# Patient Record
Sex: Male | Born: 1985 | Race: White | Hispanic: No | Marital: Married | Smoking: Never smoker
Health system: Southern US, Community
[De-identification: ages and names within clinical notes are randomized; demographics above are authoritative.]

## PROBLEM LIST (undated history)

## (undated) DIAGNOSIS — Z789 Other specified health status: Secondary | ICD-10-CM

## (undated) HISTORY — PX: OTHER SURGICAL HISTORY: SHX169

## (undated) HISTORY — DX: Other specified health status: Z78.9

---
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
## 2004-08-03 ENCOUNTER — Emergency Department (HOSPITAL_COMMUNITY): Admission: EM | Admit: 2004-08-03 | Discharge: 2004-08-03 | Payer: Self-pay | Admitting: Emergency Medicine

## 2004-12-16 ENCOUNTER — Encounter: Admission: RE | Admit: 2004-12-16 | Discharge: 2004-12-16 | Payer: Self-pay | Admitting: Internal Medicine

## 2007-01-26 ENCOUNTER — Encounter: Admission: RE | Admit: 2007-01-26 | Discharge: 2007-01-26 | Payer: Self-pay | Admitting: Internal Medicine

## 2011-11-30 ENCOUNTER — Ambulatory Visit (HOSPITAL_COMMUNITY)
Admission: RE | Admit: 2011-11-30 | Discharge: 2011-11-30 | Disposition: A | Discharge status: Home / Self Care | Payer: BC Managed Care – PPO | Source: Ambulatory Visit | Attending: Internal Medicine | Admitting: Internal Medicine

## 2011-11-30 DIAGNOSIS — R011 Cardiac murmur, unspecified: Secondary | ICD-10-CM

## 2011-11-30 NOTE — Progress Notes (Signed)
  Echocardiogram 2D Echocardiogram has been performed.  Jorje Guild [facility] 11/30/2011, 11:04 AM

## 2017-08-09 ENCOUNTER — Other Ambulatory Visit: Payer: Self-pay | Admitting: Internal Medicine

## 2017-08-09 ENCOUNTER — Ambulatory Visit
Admission: RE | Admit: 2017-08-09 | Discharge: 2017-08-09 | Disposition: A | Discharge status: Home / Self Care | Payer: 59 | Source: Ambulatory Visit | Attending: Internal Medicine | Admitting: Internal Medicine

## 2017-08-09 DIAGNOSIS — M5489 Other dorsalgia: Secondary | ICD-10-CM

## 2018-05-23 ENCOUNTER — Ambulatory Visit
Admission: RE | Admit: 2018-05-23 | Discharge: 2018-05-23 | Disposition: A | Discharge status: Home / Self Care | Payer: 59 | Source: Ambulatory Visit | Attending: Internal Medicine | Admitting: Internal Medicine

## 2018-05-23 ENCOUNTER — Other Ambulatory Visit: Payer: Self-pay | Admitting: Internal Medicine

## 2018-05-23 DIAGNOSIS — R05 Cough: Secondary | ICD-10-CM

## 2018-05-23 DIAGNOSIS — R059 Cough, unspecified: Secondary | ICD-10-CM

## 2018-06-11 ENCOUNTER — Ambulatory Visit
Admission: RE | Admit: 2018-06-11 | Discharge: 2018-06-11 | Disposition: A | Discharge status: Home / Self Care | Payer: 59 | Source: Ambulatory Visit | Attending: Internal Medicine | Admitting: Internal Medicine

## 2018-06-11 ENCOUNTER — Other Ambulatory Visit: Payer: Self-pay | Admitting: Internal Medicine

## 2018-06-11 DIAGNOSIS — J189 Pneumonia, unspecified organism: Secondary | ICD-10-CM

## 2018-06-12 ENCOUNTER — Encounter: Payer: Self-pay | Admitting: *Deleted

## 2018-06-13 ENCOUNTER — Encounter: Payer: Self-pay | Admitting: Diagnostic Neuroimaging

## 2018-06-13 ENCOUNTER — Ambulatory Visit: Payer: 59 | Admitting: Diagnostic Neuroimaging

## 2018-06-13 DIAGNOSIS — G4489 Other headache syndrome: Secondary | ICD-10-CM | POA: Diagnosis not present

## 2018-06-13 NOTE — Progress Notes (Signed)
[facility]  PATIENT: Jeremy Sloan DOB: 04-16-1986  REFERRING CLINICIAN: Madelin Rear, MD HISTORY FROM: patient  REASON FOR VISIT: new consult    HISTORICAL  CHIEF COMPLAINT:  Chief Complaint  Patient presents with  . Numbness    rm 6, New Pt, " visual disturbances, blind spot on left, transient hand numbness, headache, fogginess"    HISTORY OF PRESENT ILLNESS:   32 year old male here for evaluation of abnormal spell.  Last Friday patient had sudden onset of visual disturbance and visual loss lasting for 20 minutes.  He was having trouble reading his computer screen.  Soon after his left hand became numb.  He also felt numbness in his mouth and roof of mouth.  He had a dull nasal headache following this.  Symptoms recurred this past Monday in a similar fashion.  He recalls seeing visual disturbance slowly moving across his visual field to the left side.  He also is noticed "overexposed" halo around objects towards the left side.  Left hand again became numb.  Then mouth became numb.  Then he had a dull nasal type headache.  No nausea or vomiting.  No sensitive light or sound.  Patient does have history of headaches from age 54 years old until 32 years old with severe quality and vomiting.  He was never officially diagnosed with migraine.  Patient also has family history of cerebral aneurysm, ruptured with stroke in his mother.  No family history of migraine.  2 weeks prior to onset of symptoms patient was diagnosed with severe pneumonia, "bedridden" for approximately 2 weeks, with significantly decreased p.o. intake and 15 pound weight loss.  His neurologic symptoms and headache occurred approximately 1 week after resolution of pneumonia and returning to work.   REVIEW OF SYSTEMS: Full 14 system review of systems performed and negative with exception of: Memory loss weight loss.  ALLERGIES: No Known Allergies  HOME MEDICATIONS: Outpatient Medications  Prior to Visit  Medication Sig Dispense Refill  . ALPRAZolam (XANAX) 0.25 MG tablet Take 0.25 mg by mouth as needed for anxiety.    Marland Kitchen HYDROcodone-acetaminophen (NORCO/VICODIN) 5-325 MG tablet Take 1 tablet by mouth every 6 (six) hours as needed for moderate pain.     No facility-administered medications prior to visit.     PAST MEDICAL HISTORY: Past Medical History:  Diagnosis Date  . Known health problems: none     PAST SURGICAL HISTORY: Past Surgical History:  Procedure Laterality Date  . none     FAMILY HISTORY: Family History  Problem Relation Age of Onset  . Stroke Mother        due to aneurysm  . Cerebral aneurysm Mother        ruptured    SOCIAL HISTORY: Social History   Socioeconomic History  . Marital status: Married    Spouse name: Worthy Rancher  . Number of children: 2  . Years of education: BS  . Highest education level: Not on file  Occupational History    Comment: [employer]  Social Needs  . Financial resource strain: Not on file  . Food insecurity:    Worry: Not on file    Inability: Not on file  . Transportation needs:    Medical: Not on file    Non-medical: Not on file  Tobacco Use  . Smoking status: Never Smoker  . Smokeless tobacco: Never Used  Substance and Sexual Activity  . Alcohol use: Yes    Comment: 06/13/18 3 x weekly  .  Drug use: Never  . Sexual activity: Not on file  Lifestyle  . Physical activity:    Days per week: Not on file    Minutes per session: Not on file  . Stress: Not on file  Relationships  . Social connections:    Talks on phone: Not on file    Gets together: Not on file    Attends religious service: Not on file    Active member of club or organization: Not on file    Attends meetings of clubs or organizations: Not on file    Relationship status: Not on file  . Intimate partner violence:    Fear of current or ex partner: Not on file    Emotionally abused: Not on file    Physically abused: Not on file     Forced sexual activity: Not on file  Other Topics Concern  . Not on file  Social History Narrative   Lives with family   Caffeine- coffee 2 cups daily     PHYSICAL EXAM  GENERAL EXAM/CONSTITUTIONAL: Vitals:  Vitals:   06/13/18 1425  BP: (!) 109/56  Pulse: 66  Weight: 143 lb (64.9 kg)  Height: 6' (1.829 m)     Body mass index is 19.39 kg/m. Wt Readings from Last 3 Encounters:  06/13/18 143 lb (64.9 kg)     Patient is in no distress; well developed, nourished and groomed; neck is supple  CARDIOVASCULAR:  Examination of carotid arteries is normal; no carotid bruits  Regular rate and rhythm, no murmurs  Examination of peripheral vascular system by observation and palpation is normal  EYES:  Ophthalmoscopic exam of optic discs and posterior segments is normal; no papilledema or hemorrhages  Visual Acuity Screening   Right eye Left eye Both eyes  Without correction:     With correction: 20/30 20/40   Comments: contacts    MUSCULOSKELETAL:  Gait, strength, tone, movements noted in Neurologic exam below  NEUROLOGIC: MENTAL STATUS:  No flowsheet data found.  awake, alert, oriented to person, place and time  recent and remote memory intact  normal attention and concentration  language fluent, comprehension intact, naming intact  fund of knowledge appropriate  CRANIAL NERVE:   2nd - no papilledema on fundoscopic exam  2nd, 3rd, 4th, 6th - pupils equal and reactive to light, visual fields full to confrontation, extraocular muscles intact, no nystagmus  5th - facial sensation symmetric  7th - facial strength symmetric  8th - hearing intact  9th - palate elevates symmetrically, uvula midline  11th - shoulder shrug symmetric  12th - tongue protrusion midline  MOTOR:   normal bulk and tone, full strength in the BUE, BLE  SENSORY:   normal and symmetric to light touch, temperature, vibration  COORDINATION:   finger-nose-finger, fine  finger movements normal  REFLEXES:   deep tendon reflexes present and symmetric  GAIT/STATION:   narrow based gait; able to walk tandem; romberg is negative     DIAGNOSTIC DATA (LABS, IMAGING, TESTING) - I reviewed patient records, labs, notes, testing and imaging myself where available.  No results found for: WBC, HGB, HCT, MCV, PLT No results found for: NA, K, CL, CO2, GLUCOSE, BUN, CREATININE, CALCIUM, PROT, ALBUMIN, AST, ALT, ALKPHOS, BILITOT, GFRNONAA, GFRAA No results found for: CHOL, HDL, LDLCALC, LDLDIRECT, TRIG, CHOLHDL No results found for: [id number] No results found for: VITAMINB12 No results found for: TSH   08/03/04 CT head [I reviewed images myself and agree with interpretation. -VRP]  - Normal  unenhanced head CT.     ASSESSMENT AND PLAN  32 y.o. year old male here with history of headaches with migraine features from [age] to 32 years old, now with 2 events suspicious for complicated migraine.  TIA or other secondary cause less likely.  We will proceed with new problem work-up.   Ddx: migraine with aura / complicated migraine (likely) vs TIA (less likely)  1. Other headache syndrome      PLAN:  - MRI brain / MRA head (rule out stroke, demyelination, cerebral aneurysm) - monitor symptoms; ibuprofen as need for headaches; may consider migraine prevention in future if frequency increases  Orders Placed This Encounter  Procedures  . MR BRAIN W WO CONTRAST  . MR MRA HEAD WO CONTRAST   Return pending test results.    Suanne MarkerVIKRAM R. Bay Jarquin, MD 06/13/2018, 2:42 PM Certified in Neurology, Neurophysiology and Neuroimaging  [facility] [address], [address] [place], [place] [address] [phone]

## 2018-06-14 ENCOUNTER — Telehealth: Payer: Self-pay | Admitting: Diagnostic Neuroimaging

## 2018-06-14 NOTE — Telephone Encounter (Signed)
Spoke to the patient and informed him it would cost about $2,693.20 due to his deductible not met I did offer payment plan.Marland Kitchen He informed me he would like to do some reach with his insurance and see if there is some where cheaper and get back to me.  [place]: [phone] & [phone] (exp. 06/13/18 tp 07/28/18)

## 2018-06-25 ENCOUNTER — Encounter

## 2018-06-25 ENCOUNTER — Ambulatory Visit: Payer: 59 | Admitting: Neurology

## 2018-10-30 IMAGING — CR DG LUMBAR SPINE COMPLETE 4+V
5 series · 5 of 5 positions shown · non-contrast
Comparison: None.

CLINICAL DATA: Low back pain for 2 months.  No known injury .

EXAM:
LUMBAR SPINE - COMPLETE 4+ VIEW

[t l-spine a.p.]
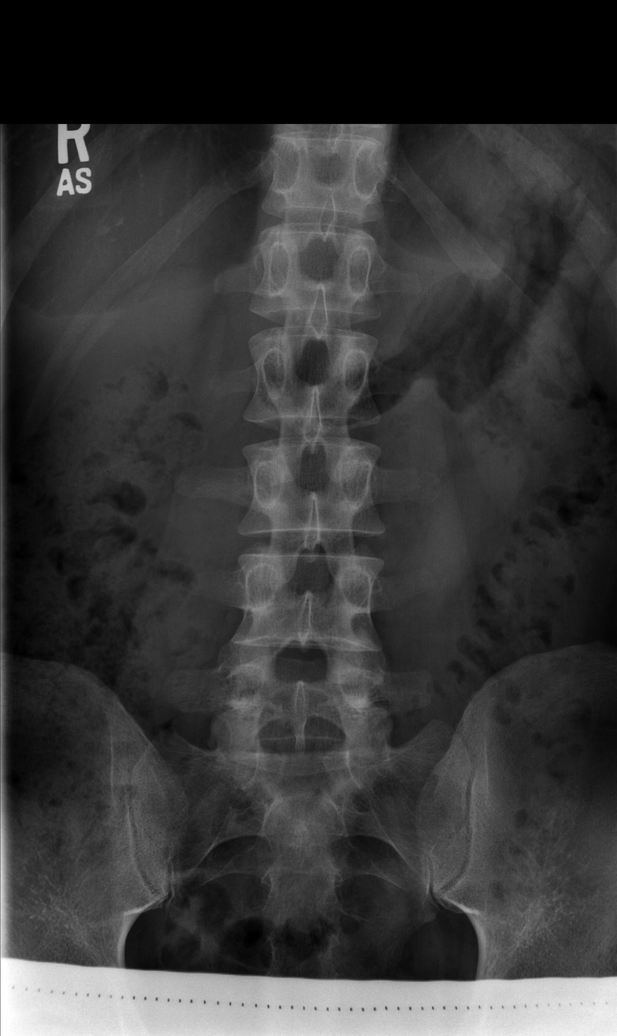

[t l-spine oblique exposure (1 of 2)]
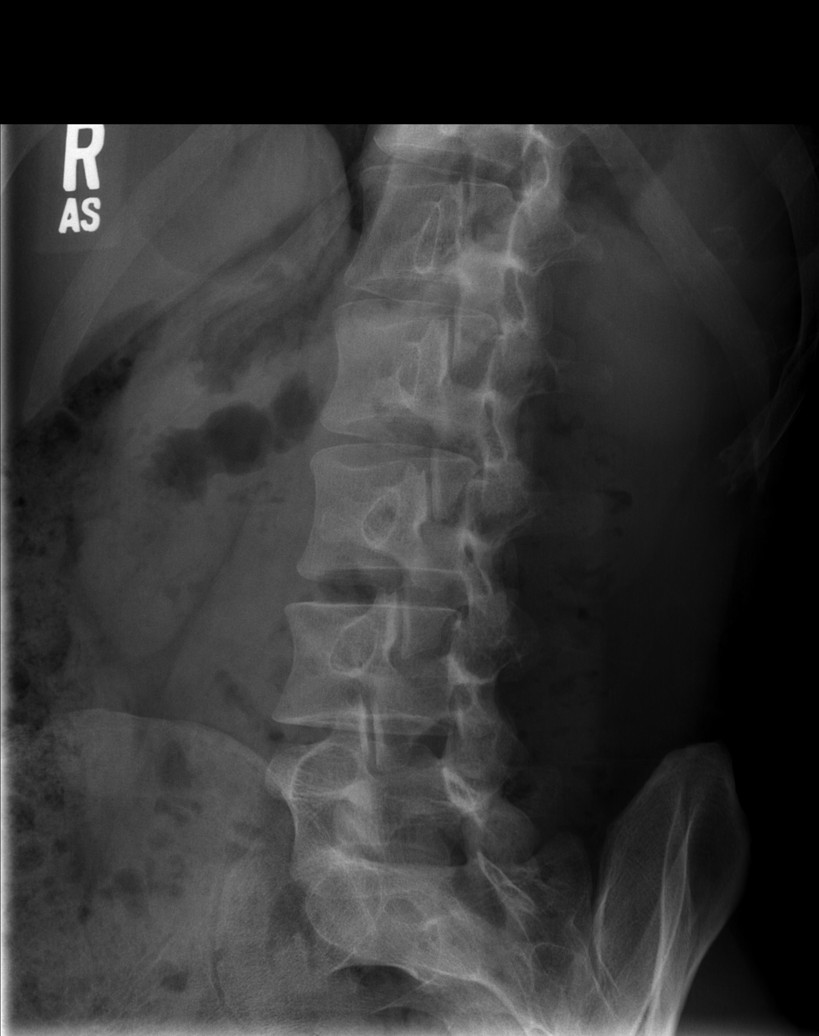

[t l-spine oblique exposure (2 of 2)]
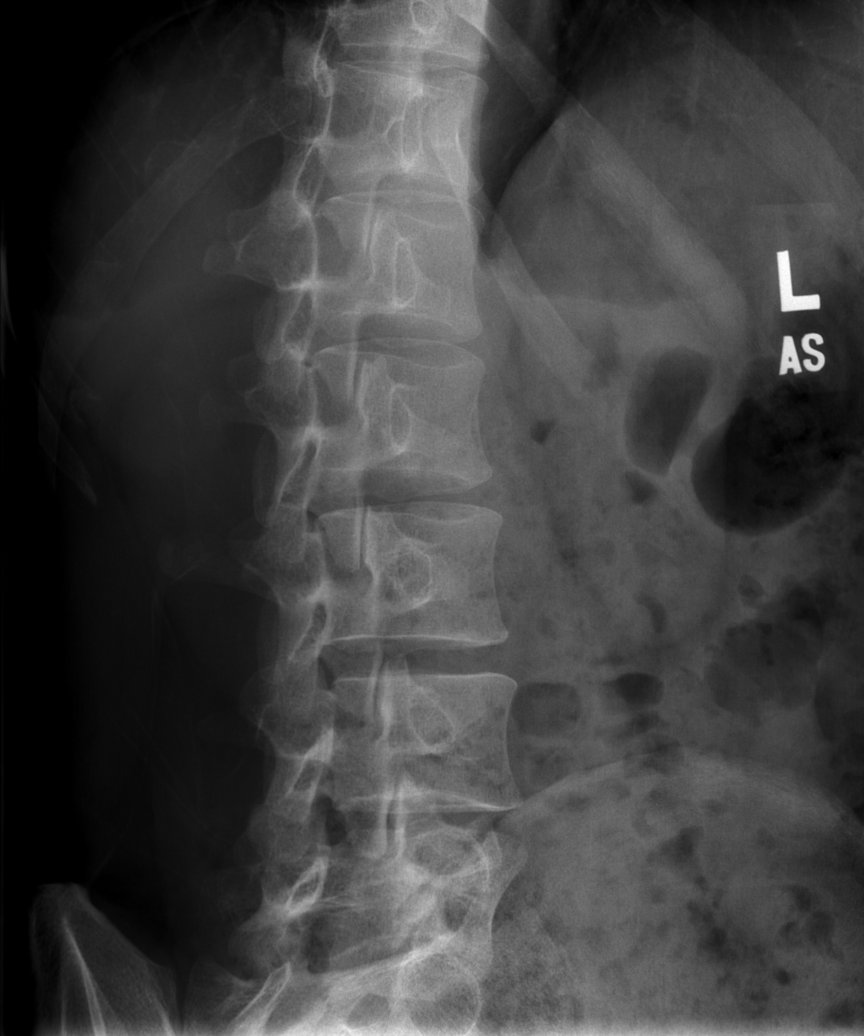

[t l-spine lat]
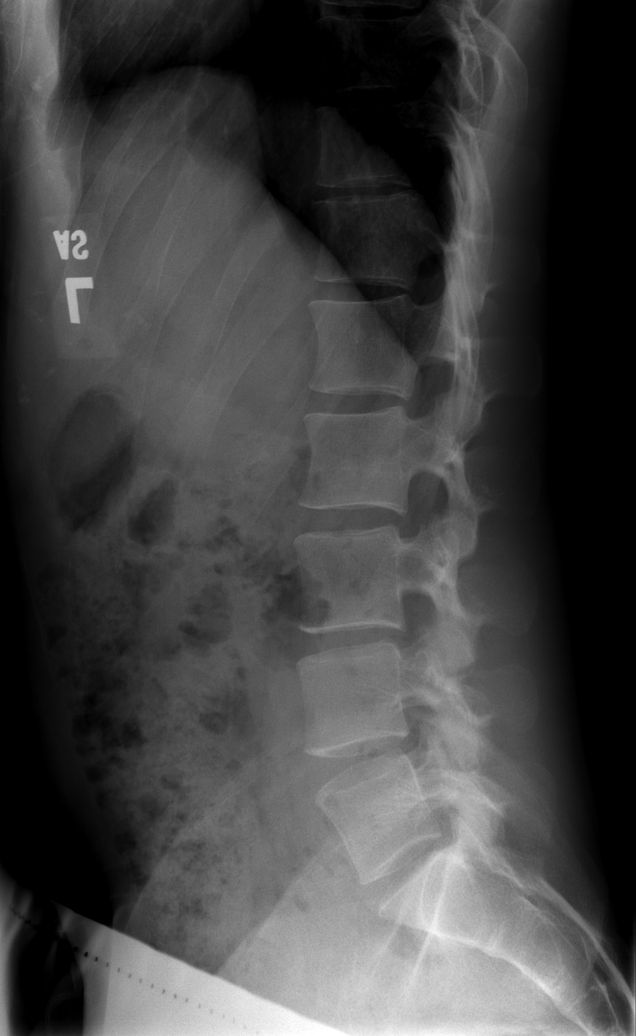

[t l-spine l5-s1 spot]
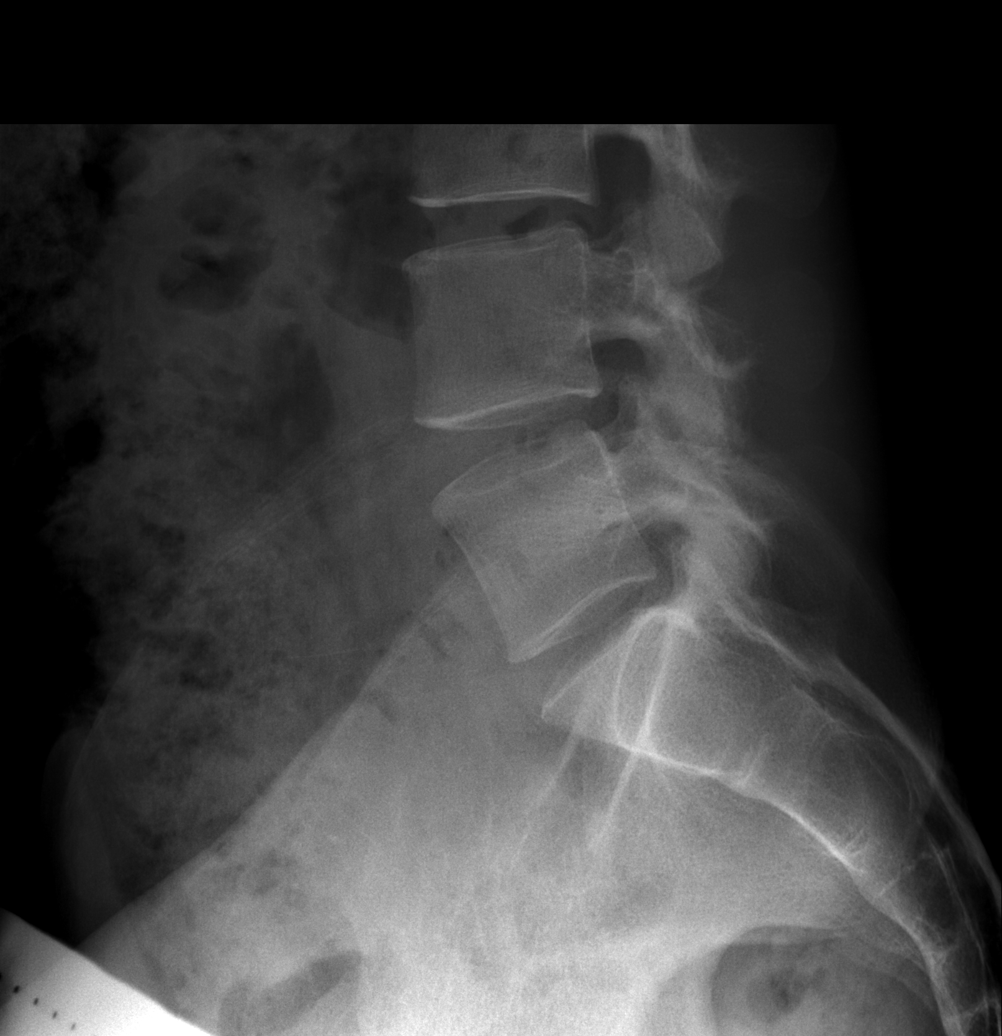

[5 of 5 positions shown; findings below may reference images not displayed]

FINDINGS: There is no evidence of lumbar spine fracture. Alignment is normal.
Intervertebral disc spaces are maintained. No focal bone lesions
identified.
IMPRESSION: Negative lumbar spine radiographs.

## 2019-08-13 IMAGING — CR DG CHEST 2V
2 series · 2 of 2 positions shown · non-contrast
Comparison: December 16, 2004

CLINICAL DATA: Chest tightness for 5 days

EXAM:
CHEST - 2 VIEW

[w chest pa]
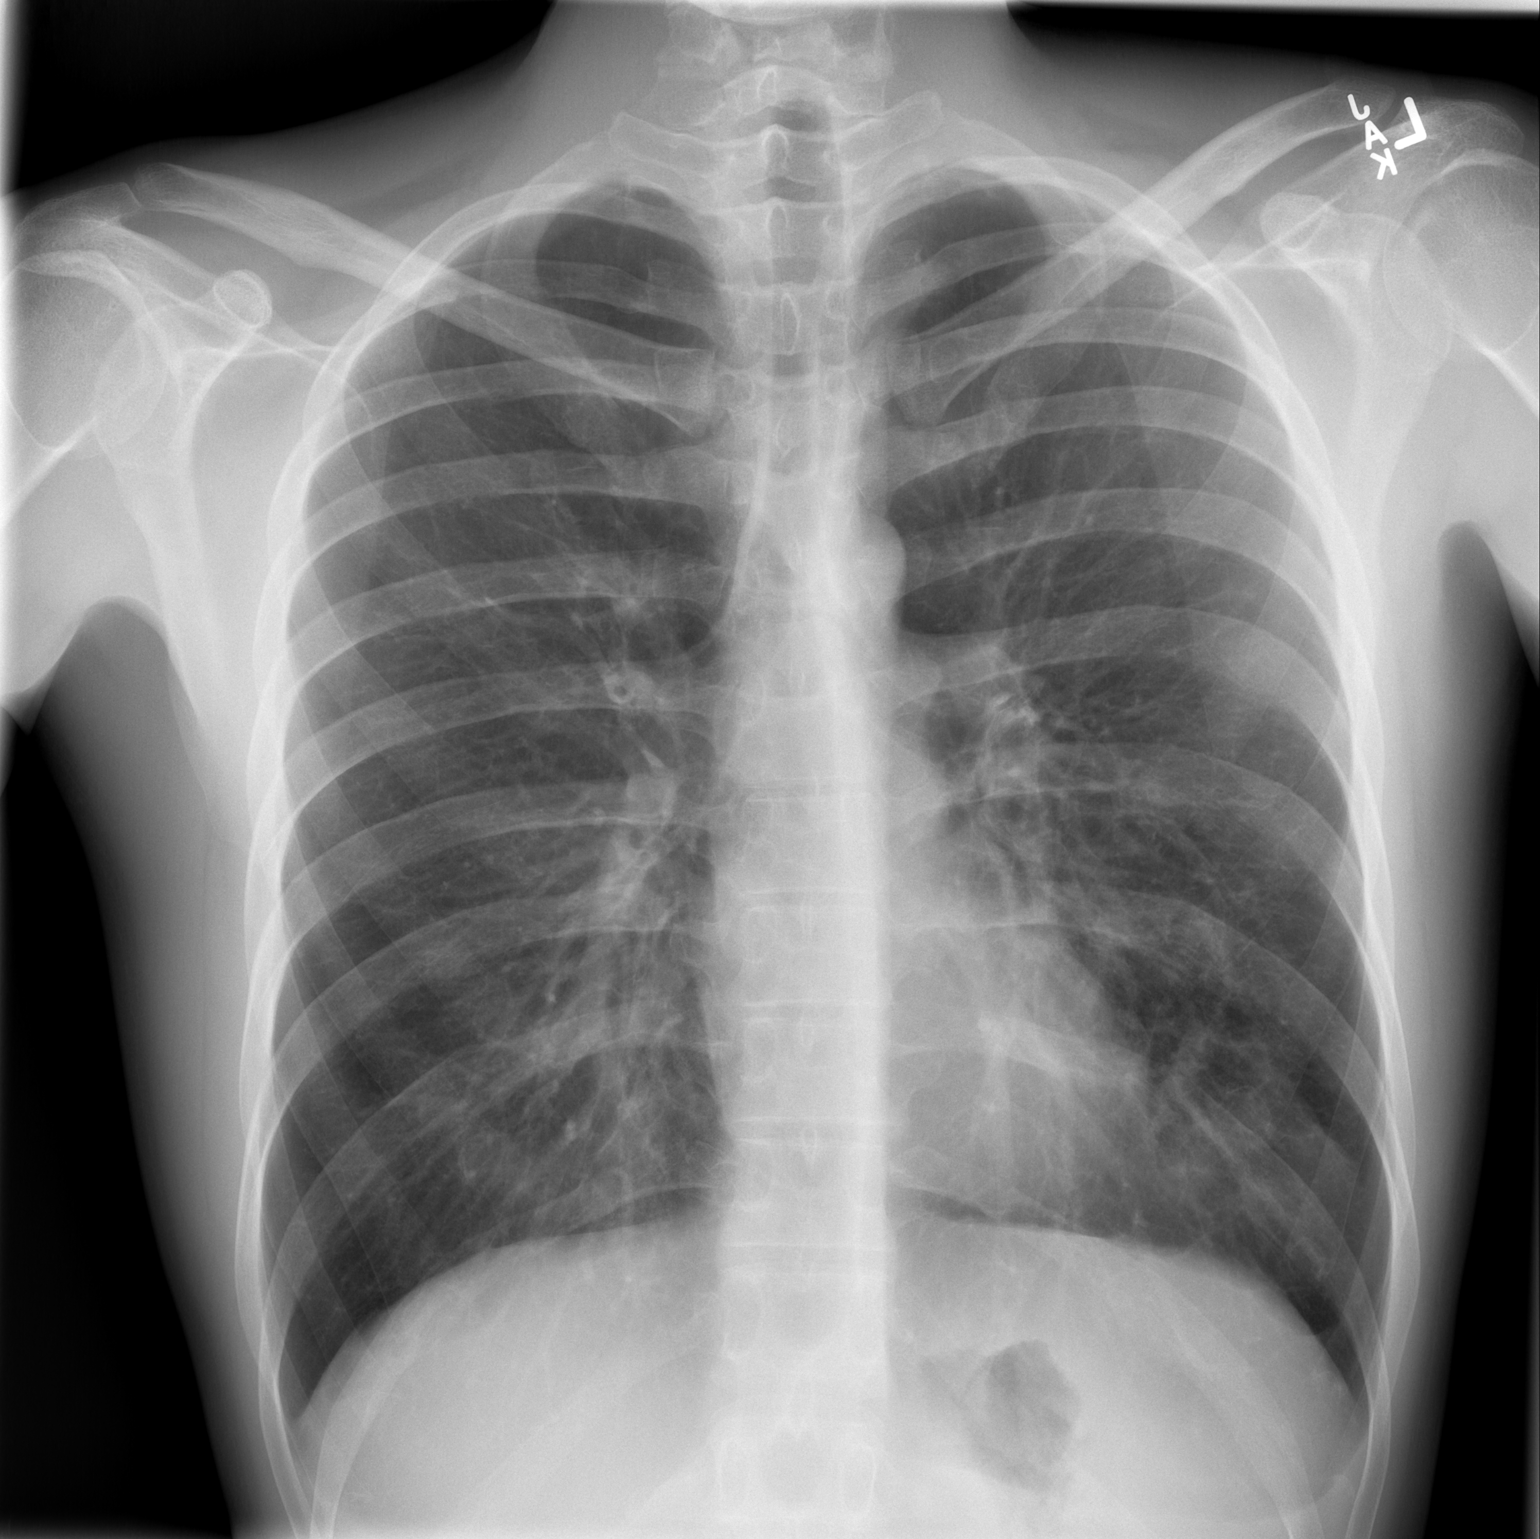

[w chest lat]
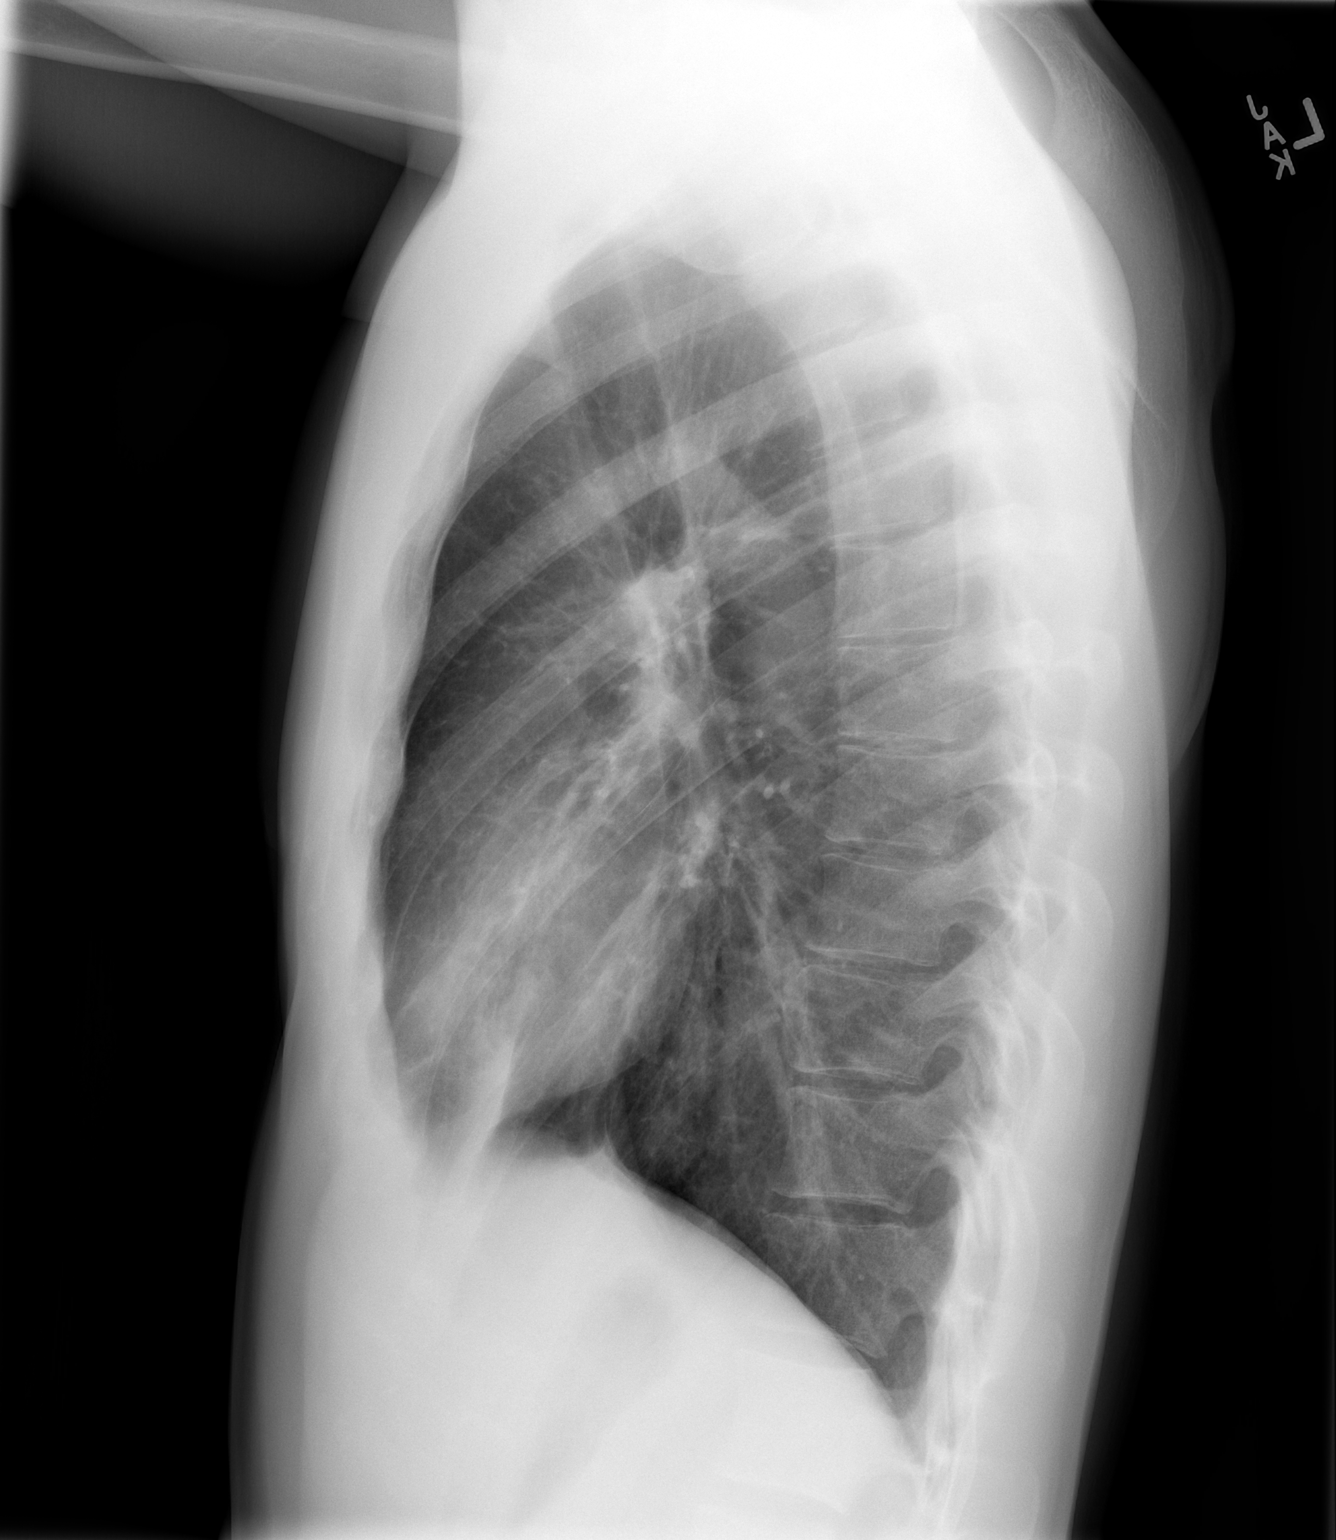

[2 of 2 positions shown; findings below may reference images not displayed]

FINDINGS: There is increased opacity over the right ventricle anteriorly on
the lateral view. It is difficult to localize within this apparent
focal infiltrate is on the left or right side on the frontal view.
Lungs elsewhere clear. Heart size and pulmonary vascularity are
normal. No adenopathy. No bone lesions.
IMPRESSION: Infiltrate seen on the lateral view overlying the heart anteriorly.
It is difficult to localize to left versus right side on the frontal
view. Lungs elsewhere clear. Heart size normal. No evident
adenopathy.

These results will be called to the ordering clinician or
representative by the Radiologist Assistant, and communication
documented in the PACS or zVision Dashboard.

## 2019-09-01 IMAGING — DX DG CHEST 2V
2 series · 2 of 2 positions shown · non-contrast
Comparison: 05/23/2018, 12/16/2004

CLINICAL DATA: Follow-up pneumonia

EXAM:
CHEST - 2 VIEW

[dg chest 2 view (1 of 2)]
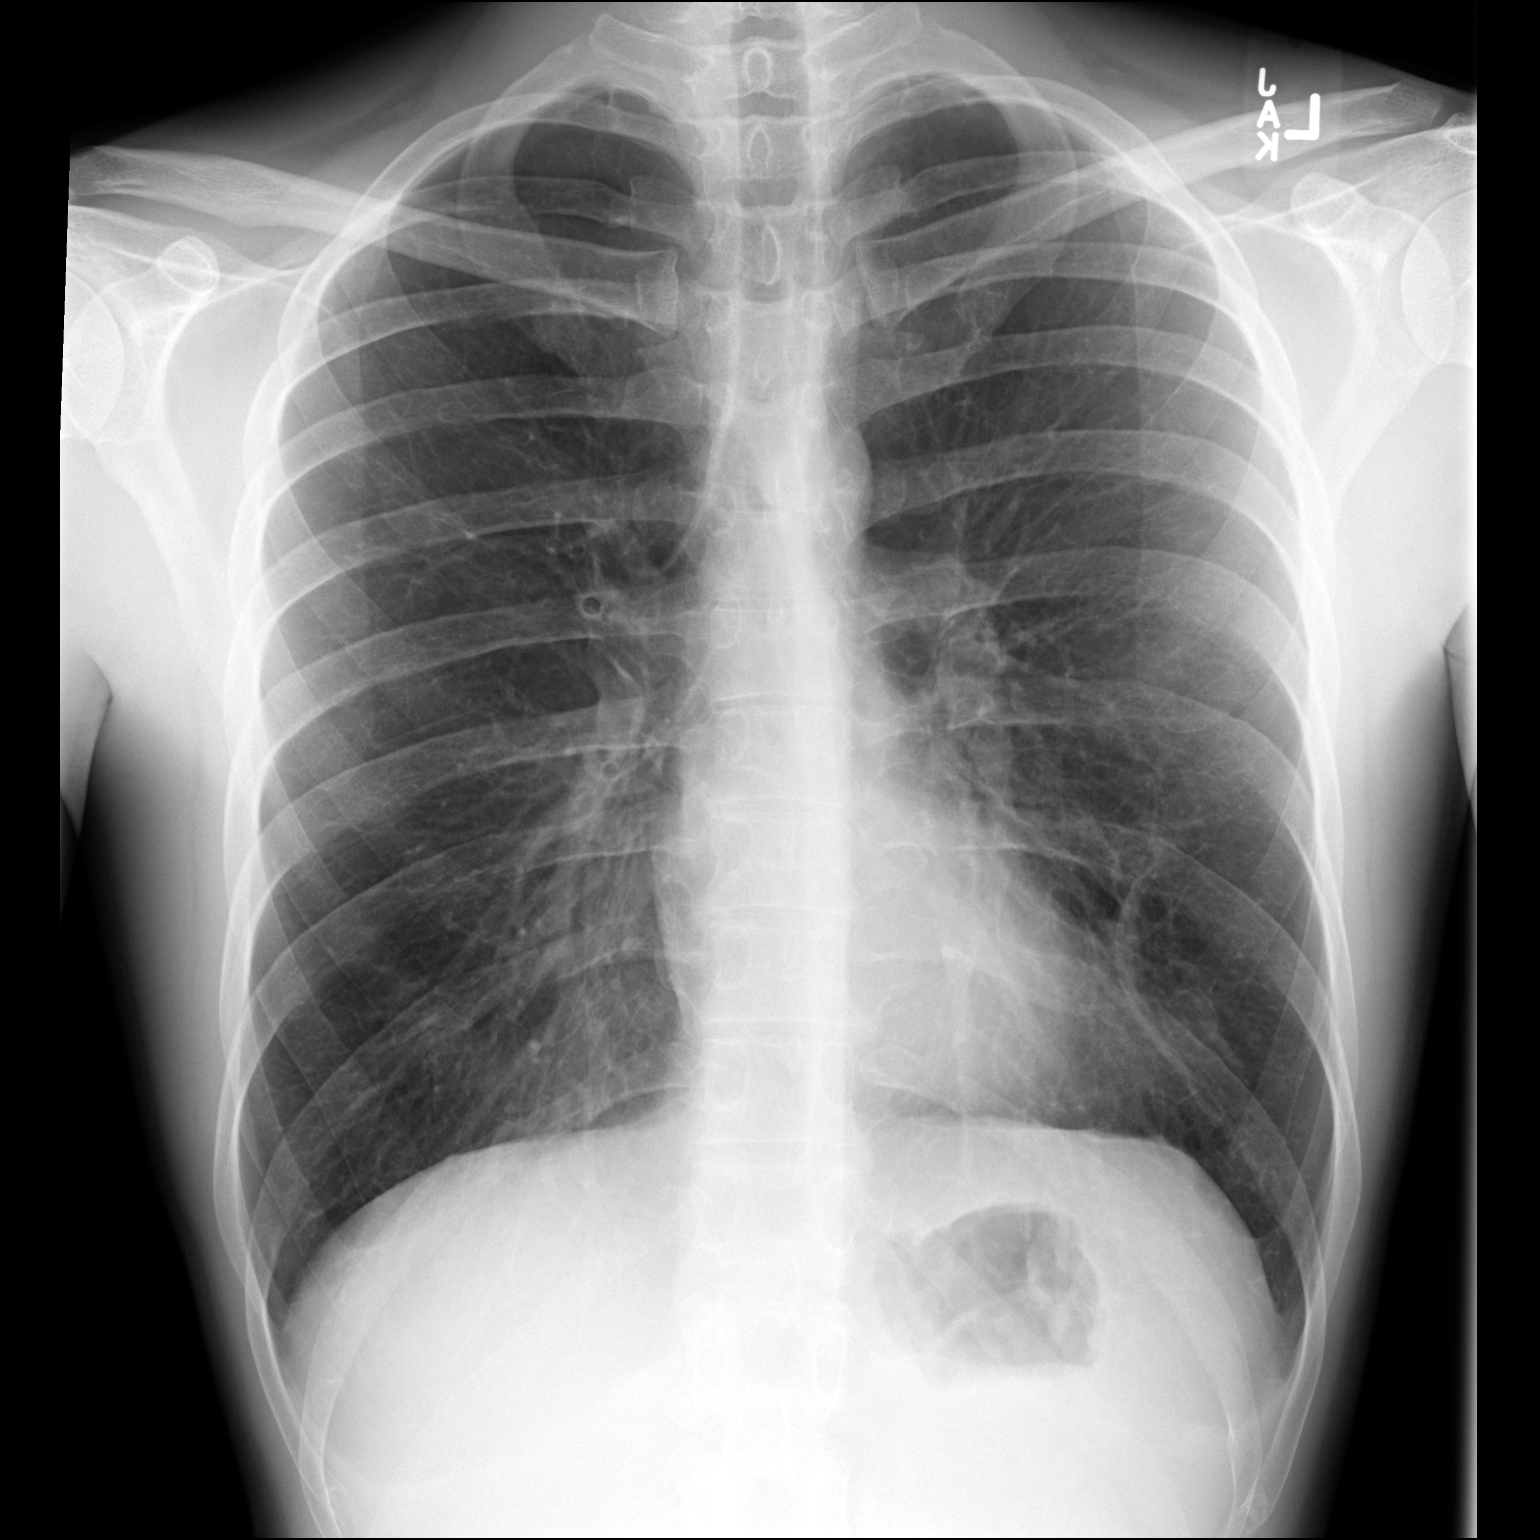

[dg chest 2 view (2 of 2)]
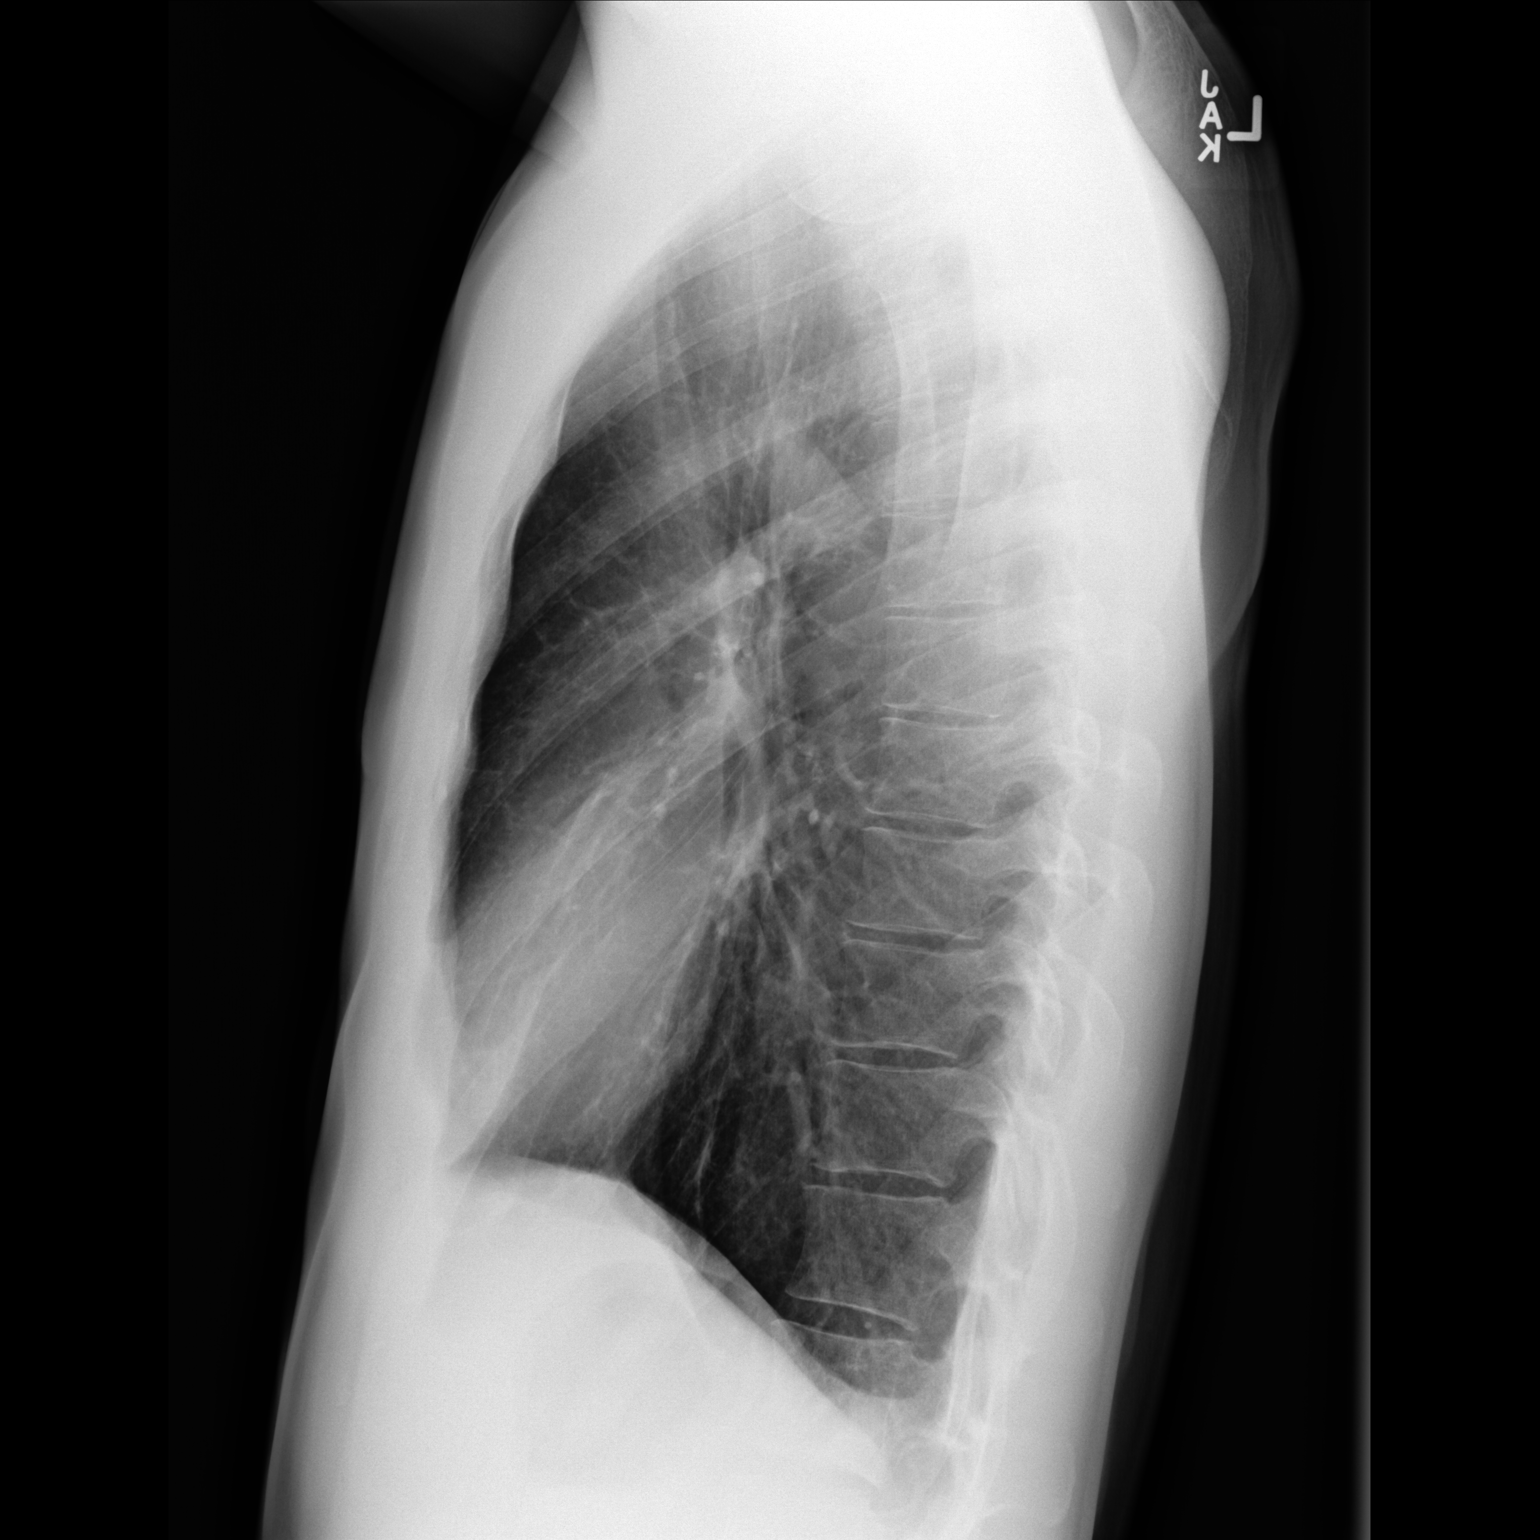

[2 of 2 positions shown; findings below may reference images not displayed]

FINDINGS: A small amount of opacity remains anteriorly on the lateral view,
not well localized on frontal view. No pleural effusion. Normal
heart size. No pneumothorax.
IMPRESSION: Small amount of residual opacity anteriorly on the lateral view, not
well localized on the frontal view. No new pulmonary infiltrate is
seen.
